# Patient Record
Sex: Male | Born: 2000 | Race: White | Hispanic: No | Marital: Single | Smoking: Never smoker
Health system: Southern US, Community
[De-identification: ages and names within clinical notes are randomized; demographics above are authoritative.]

## PROBLEM LIST (undated history)

## (undated) HISTORY — PX: EXPLORATION POST OPERATIVE OPEN HEART: SHX5061

---
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
## 2023-11-28 ENCOUNTER — Ambulatory Visit

## 2023-11-28 DIAGNOSIS — L508 Other urticaria: Secondary | ICD-10-CM | POA: Diagnosis not present

## 2023-11-28 DIAGNOSIS — L509 Urticaria, unspecified: Secondary | ICD-10-CM

## 2023-11-28 MED ORDER — TRIAMCINOLONE ACETONIDE 0.1 % EX OINT: 1.0000 | TOPICAL_OINTMENT | Freq: Two times a day (BID) | CUTANEOUS | 1 refills | Status: AC | PRN

## 2023-11-28 NOTE — Patient Instructions (Addendum)
 For Hives (urticaria); dermatographism or Itch: Start non sedating antihistamine  Zyrtec 10 mg daily.  All these are non-prescription (Over the Counter).   Start out with 1 pill a day.   After a week if not improving may increase to 2 pills a day.   After another week if not improving may increase to 3 pills a day.   After another week if still not improving may take up to 4 pills a day. Stay at highest dose that keeps condition controlled, but only up to 4 pills a day. Stay at the controlling dose for at least 2 weeks. Contact office if taking 4 pills of antihistamine a day for at least 2 weeks without control of condition as other options may be available.      Due to recent changes in healthcare laws, you may see results of your pathology and/or laboratory studies on MyChart before the doctors have had a chance to review them. We understand that in some cases there may be results that are confusing or concerning to you. Please understand that not all results are received at the same time and often the doctors may need to interpret multiple results in order to provide you with the best plan of care or course of treatment. Therefore, we ask that you please give us  2 business days to thoroughly review all your results before contacting the office for clarification. Should we see a critical lab result, you will be contacted sooner.   If You Need Anything After Your Visit  If you have any questions or concerns for your doctor, please call our main line at [phone] and press option 4 to reach your doctor's medical assistant. If no one answers, please leave a voicemail as directed and we will return your call as soon as possible. Messages left after 4 pm will be answered the following business day.   You may also send us  a message via MyChart. We typically respond to MyChart messages within 1-2 business days.  For prescription refills, please ask your pharmacy to contact our office. Our fax  number is [phone].  If you have an urgent issue when the clinic is closed that cannot wait until the next business day, you can page your doctor at the number below.    Please note that while we do our best to be available for urgent issues outside of office hours, we are not available 24/7.   If you have an urgent issue and are unable to reach us , you may choose to seek medical care at your doctor's office, retail clinic, urgent care center, or emergency room.  If you have a medical emergency, please immediately call 911 or go to the emergency department.  Pager Numbers  - Dr. Hester: [phone]  - Dr. Jackquline: [phone]  - Dr. Claudene: [phone]   - Dr. Raymund: [phone]  In the event of inclement weather, please call our main line at [phone] for an update on the status of any delays or closures.  Dermatology Medication Tips: Please keep the boxes that topical medications come in in order to help keep track of the instructions about where and how to use these. Pharmacies typically print the medication instructions only on the boxes and not directly on the medication tubes.   If your medication is too expensive, please contact our office at [phone] option 4 or send us  a message through MyChart.   We are unable to tell what your co-pay for medications will be in advance  as this is different depending on your insurance coverage. However, we may be able to find a substitute medication at lower cost or fill out paperwork to get insurance to cover a needed medication.   If a prior authorization is required to get your medication covered by your insurance company, please allow us  1-2 business days to complete this process.  Drug prices often vary depending on where the prescription is filled and some pharmacies may offer cheaper prices.  The website www.goodrx.com contains coupons for medications through different pharmacies. The prices here do not account for  what the cost may be with help from insurance (it may be cheaper with your insurance), but the website can give you the price if you did not use any insurance.  - You can print the associated coupon and take it with your prescription to the pharmacy.  - You may also stop by our office during regular business hours and pick up a GoodRx coupon card.  - If you need your prescription sent electronically to a different pharmacy, notify our office through [facility] or by phone at [phone] option 4.     Si Usted Necesita Algo Despus de Su Visita  Tambin puede enviarnos un mensaje a travs de Clinical cytogeneticist. Por lo general respondemos a los mensajes de MyChart en el transcurso de 1 a 2 das hbiles.  Para renovar recetas, por favor pida a su farmacia que se ponga en contacto con nuestra oficina. Randi lakes de fax es [place] [phone].  Si tiene un asunto urgente cuando la clnica est cerrada y que no puede esperar hasta el siguiente da hbil, puede llamar/localizar a su doctor(a) al nmero que aparece a continuacin.   Por favor, tenga en cuenta que aunque hacemos todo lo posible para estar disponibles para asuntos urgentes fuera del horario de [place], no estamos disponibles las 24 horas del da, los 7 [address]  [address] de la [place].   Si tiene un problema urgente y no puede comunicarse con nosotros, puede optar por buscar atencin mdica  en el consultorio de su doctor(a), en una clnica privada, en un centro de atencin urgente o en una sala de emergencias.  Si tiene Engineer, drilling, por favor llame inmediatamente al 911 o vaya a la sala de emergencias.  Nmeros de bper  - Dr. Hester: [phone]  - Dra. Jackquline: [phone]  - Dr. Claudene: [phone]  - Dra. Kitts: [phone]  En caso de inclemencias del [place], por favor llame a nuestra lnea principal al [phone] para una actualizacin sobre el estado de cualquier retraso o cierre.  Consejos para la medicacin en  dermatologa: Por favor, guarde las cajas en las que vienen los medicamentos de uso tpico para ayudarle a seguir las instrucciones sobre dnde y cmo usarlos. Las farmacias generalmente imprimen las instrucciones del medicamento slo en las cajas y no directamente en los tubos del [place].   Si su medicamento es muy caro, por favor, pngase en contacto con landry rieger llamando al [phone] y presione la opcin 4 o envenos un mensaje a travs de Clinical cytogeneticist.   No podemos decirle cul ser su copago por los medicamentos por adelantado ya que esto es diferente dependiendo de la cobertura de su seguro. Sin embargo, es posible que podamos encontrar un medicamento sustituto a Audiological scientist un formulario para que el seguro cubra el medicamento que se considera necesario.   Si se requiere una autorizacin previa para que su compaa de seguros malta su medicamento, por favor permtanos  de 1 a 2 das hbiles para completar [address]  Los precios de los medicamentos varan con frecuencia dependiendo del Environmental consultant de dnde se surte la receta y alguna farmacias pueden ofrecer precios ms baratos.  El sitio web www.goodrx.com tiene cupones para medicamentos de Health and safety inspector. Los precios aqu no tienen en cuenta lo que podra costar con la ayuda del seguro (puede ser ms barato con su seguro), pero el sitio web puede darle el precio si no utiliz Tourist information centre manager.  - Puede imprimir el cupn correspondiente y llevarlo con su receta a la farmacia.  - Tambin puede pasar por nuestra oficina durante el horario de atencin regular y Education officer, museum una tarjeta de cupones de GoodRx.  - Si necesita que su receta se enve electrnicamente a una farmacia diferente, informe a nuestra oficina a travs de MyChart de [place] o por telfono llamando al [phone] y presione la opcin 4.

## 2023-11-28 NOTE — Progress Notes (Signed)
   New Patient Visit   Subjective  Isaac Taylor is a 23 y.o. male who presents for the following: Patient c/o hives on his body that will come and go for ~ 6 weeks now, patient report hives started after he had taken Prednisone for a back issue 2 months ago. Also took meloxicam . NO recent illness.    Mother is with patient and contributes to history.    The following portions of the chart were reviewed this encounter and updated as appropriate: medications, allergies, medical history  Review of Systems:  No other skin or systemic complaints except as noted in HPI or Assessment and Plan.  Objective  Well appearing patient in no apparent distress; mood and affect are within normal limits.   A focused examination was performed of the following areas: Face,chest,back   Pink edematous plaques on arms and legs + dermatographism   Relevant exam findings are noted in the Assessment and Plan.       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 Condition is symptomatic and bothersome to patient. Patient is flaring and not currently at treatment goal.  - Explained the process of cholinergic urticaria that can be associated with overheating, overstimulation - Initiating cetirizine 10 mg (OTC) 1-3 pills daily as tolerated.   - Advised can use up to 4x the recommended dose of antihistamines - Stressed the importance of taking these pills even when not itching.  - Encouraged avoidance of exacerbating factors (e.g. Heat) - Can consider Allergy referral, omalizumab, dupixent in the future   start triamcinolone  ointment 0.1% twice daily to affected areas of skin Discussed side effect of potent topical steroids including atrophy, dyspigmentation, striae, telangectasia, folliculitis, loss of skin pigment, hair growth, tachyphylaxis, risk of systemic absorption with missuse.  - Declined allergy referral    No follow-ups on  file.  IFay Kirks, CMA, am acting as scribe for Lauraine JAYSON Kanaris, MD .   Documentation: I have reviewed the above documentation for accuracy and completeness, and I agree with the above.  Lauraine JAYSON Kanaris, MD
